# Patient Record
Sex: Male | Born: 2005
Health system: Southern US, Community
[De-identification: ages and names within clinical notes are randomized; demographics above are authoritative.]

## PROBLEM LIST (undated history)

## (undated) DIAGNOSIS — T7840XA Allergy, unspecified, initial encounter: Secondary | ICD-10-CM

## (undated) HISTORY — DX: Allergy, unspecified, initial encounter: T78.40XA

## (undated) HISTORY — PX: CIRCUMCISION: SUR203

---
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
## 2023-03-14 ENCOUNTER — Ambulatory Visit: Payer: BC Managed Care – PPO | Admitting: Dermatology

## 2023-09-11 DIAGNOSIS — R519 Headache, unspecified: Secondary | ICD-10-CM | POA: Diagnosis not present

## 2023-09-13 DIAGNOSIS — G4489 Other headache syndrome: Secondary | ICD-10-CM | POA: Diagnosis not present

## 2023-09-13 DIAGNOSIS — Z7689 Persons encountering health services in other specified circumstances: Secondary | ICD-10-CM | POA: Diagnosis not present

## 2023-10-18 DIAGNOSIS — H6123 Impacted cerumen, bilateral: Secondary | ICD-10-CM | POA: Diagnosis not present

## 2023-10-18 DIAGNOSIS — Z0000 Encounter for general adult medical examination without abnormal findings: Secondary | ICD-10-CM | POA: Diagnosis not present
